# Patient Record
Sex: Female | Born: 1975
Health system: Southern US, Community
[De-identification: ages and names within clinical notes are randomized; demographics above are authoritative.]

---

## 2002-09-09 ENCOUNTER — Emergency Department (HOSPITAL_COMMUNITY): Admission: EM | Admit: 2002-09-09 | Discharge: 2002-09-09 | Payer: Self-pay

## 2002-09-11 ENCOUNTER — Encounter: Payer: Self-pay | Admitting: Family Medicine

## 2002-09-11 ENCOUNTER — Inpatient Hospital Stay (HOSPITAL_COMMUNITY): Admission: AD | Admit: 2002-09-11 | Discharge: 2002-09-11 | Payer: Self-pay | Admitting: Family Medicine

## 2010-05-17 ENCOUNTER — Emergency Department (HOSPITAL_COMMUNITY)
Admission: EM | Admit: 2010-05-17 | Discharge: 2010-05-17 | Payer: Self-pay | Source: Home / Self Care | Admitting: Emergency Medicine

## 2010-06-09 ENCOUNTER — Emergency Department (HOSPITAL_COMMUNITY)
Admission: EM | Admit: 2010-06-09 | Discharge: 2010-06-09 | Disposition: A | Discharge status: Home / Self Care | Payer: Self-pay | Attending: Emergency Medicine | Admitting: Emergency Medicine

## 2010-06-09 DIAGNOSIS — L02419 Cutaneous abscess of limb, unspecified: Secondary | ICD-10-CM | POA: Insufficient documentation

## 2010-06-09 DIAGNOSIS — M79609 Pain in unspecified limb: Secondary | ICD-10-CM | POA: Insufficient documentation

## 2011-08-16 ENCOUNTER — Emergency Department (HOSPITAL_COMMUNITY): Payer: Self-pay

## 2011-08-16 ENCOUNTER — Emergency Department (HOSPITAL_COMMUNITY)
Admission: EM | Admit: 2011-08-16 | Discharge: 2011-08-16 | Disposition: A | Discharge status: Home / Self Care | Payer: Self-pay | Attending: Emergency Medicine | Admitting: Emergency Medicine

## 2011-08-16 ENCOUNTER — Encounter (HOSPITAL_COMMUNITY): Payer: Self-pay | Admitting: Emergency Medicine

## 2011-08-16 DIAGNOSIS — IMO0001 Reserved for inherently not codable concepts without codable children: Secondary | ICD-10-CM | POA: Insufficient documentation

## 2011-08-16 DIAGNOSIS — R0602 Shortness of breath: Secondary | ICD-10-CM | POA: Insufficient documentation

## 2011-08-16 DIAGNOSIS — R5381 Other malaise: Secondary | ICD-10-CM | POA: Insufficient documentation

## 2011-08-16 DIAGNOSIS — R110 Nausea: Secondary | ICD-10-CM | POA: Insufficient documentation

## 2011-08-16 DIAGNOSIS — R05 Cough: Secondary | ICD-10-CM | POA: Insufficient documentation

## 2011-08-16 DIAGNOSIS — R7309 Other abnormal glucose: Secondary | ICD-10-CM | POA: Insufficient documentation

## 2011-08-16 DIAGNOSIS — R059 Cough, unspecified: Secondary | ICD-10-CM | POA: Insufficient documentation

## 2011-08-16 DIAGNOSIS — F17200 Nicotine dependence, unspecified, uncomplicated: Secondary | ICD-10-CM | POA: Insufficient documentation

## 2011-08-16 DIAGNOSIS — R739 Hyperglycemia, unspecified: Secondary | ICD-10-CM

## 2011-08-16 DIAGNOSIS — J111 Influenza due to unidentified influenza virus with other respiratory manifestations: Secondary | ICD-10-CM | POA: Insufficient documentation

## 2011-08-16 DIAGNOSIS — R509 Fever, unspecified: Secondary | ICD-10-CM | POA: Insufficient documentation

## 2011-08-16 LAB — PREGNANCY, URINE: Preg Test, Ur: NEGATIVE

## 2011-08-16 LAB — URINALYSIS, ROUTINE W REFLEX MICROSCOPIC
Glucose, UA: 500 mg/dL — AB
Leukocytes, UA: NEGATIVE
Nitrite: NEGATIVE
Protein, ur: 100 mg/dL — AB
Urobilinogen, UA: 0.2 mg/dL (ref 0.0–1.0)

## 2011-08-16 LAB — POCT I-STAT, CHEM 8
Chloride: 99 mEq/L (ref 96–112)
HCT: 46 % (ref 36.0–46.0)
Potassium: 3.8 mEq/L (ref 3.5–5.1)

## 2011-08-16 LAB — URINE MICROSCOPIC-ADD ON

## 2011-08-16 MED ORDER — ACETAMINOPHEN 325 MG PO TABS
ORAL_TABLET | ORAL | Status: AC
  Administered 2011-08-16: 650 mg
  Filled 2011-08-16: qty 2

## 2011-08-16 NOTE — ED Provider Notes (Signed)
History     CSN: [id number]  Arrival date & time 08/16/11  1419   First MD Initiated Contact with Patient 08/16/11 1553      Chief Complaint  Patient presents with  . Influenza    (Consider location/radiation/quality/duration/timing/severity/associated sxs/prior treatment) HPI Patient presents with complaints of generalized fatigue, myalgias, nausea without any vomiting. She's had a low-grade fever at home ranging from 100-101. She's had mild somewhat productive cough. She denies any difficulty breathing. She states she has been eating and drinking normally. She has no abdominal pain or chest pain denies sore throat. She has a daughter who has similar symptoms and has begun to improve. There no other associated systemic symptoms. She tried an over-the-counter cold medicine for her symptoms which did not provide very much relief. There no other alleviating or modifying factors. History reviewed. No pertinent past medical history.  History reviewed. No pertinent past surgical history.  History reviewed. No pertinent family history.  History  Substance Use Topics  . Smoking status: Current Everyday Smoker  . Smokeless tobacco: Not on file  . Alcohol Use: Yes     occasional    OB History    Grav Para Term Preterm Abortions TAB SAB Ect Mult Living                  Review of Systems ROS reviewed and all otherwise negative except for mentioned in HPI  Allergies  Sulfa antibiotics  Home Medications   Current Outpatient Rx  Name Route Sig Dispense Refill  . OVER THE COUNTER MEDICATION Oral Take 2 tablets by mouth every 6 (six) hours as needed. As needed for cold symptoms.    Marland Kitchen OVER THE COUNTER MEDICATION Oral Take 10 mLs by mouth once as needed. Night time cold medicine. As needed for fever and cold symptoms.      BP 118/79  Pulse 71  Temp(Src) 99.6 F (37.6 C) (Oral)  Resp 16  SpO2 96%  LMP 07/06/2011 Vitals reviewed Physical Exam Physical Examination: General  appearance - alert, well appearing, and in no distress Mental status - alert, oriented to person, place, and time Eyes - pupils equal and reactive, no conjunctival injection or scleral icterus Mouth - mucous membranes moist, pharynx normal without lesions Neck - supple, no significant adenopathy Chest - clear to auscultation, no wheezes, rales or rhonchi, symmetric air entry Heart - normal rate, regular rhythm, normal S1, S2, no murmurs, rubs, clicks or gallops Abdomen - soft, nontender, nondistended, no masses or organomegaly, nabs Extremities - peripheral pulses normal, no pedal edema, no clubbing or cyanosis Skin - normal coloration and turgor, no rashes, brisk cap refill  ED Course  Procedures (including critical care time)  Labs Reviewed  URINALYSIS, ROUTINE W REFLEX MICROSCOPIC - Abnormal; Notable for the following:    APPearance CLOUDY (*)    Specific Gravity, Urine 1.031 (*)    Glucose, UA 500 (*)    Protein, ur 100 (*)    All other components within normal limits  URINE MICROSCOPIC-ADD ON - Abnormal; Notable for the following:    Squamous Epithelial / LPF FEW (*)    Bacteria, UA FEW (*)    Casts GRANULAR CAST (*)    All other components within normal limits  POCT I-STAT, CHEM 8 - Abnormal; Notable for the following:    Glucose, Bld 185 (*)    Calcium, Ion 1.10 (*)    Hemoglobin 15.6 (*)    All other components within normal limits  PREGNANCY, URINE  Dg Chest 2 View  08/16/2011  *RADIOLOGY REPORT*  Clinical Data: Cough and shortness of breath.  Fever.  CHEST - 2 VIEW  Comparison: Chest x-ray and rib films 05/17/2010.  Findings: The heart size is normal.  The lungs are clear.  The visualized soft tissues and bony thorax are unremarkable.  IMPRESSION: Negative chest.  Original Report Authenticated By: Jamesetta Orleans. MATTERN, M.D.     1. Influenza-like illness   2. Hyperglycemia       MDM  Patient presents with symptoms consistent with an influenza-like illness. She  is overall nontoxic appearing. She was given ibuprofen for body aches. Her urinalysis showed protein and glucose. Therefore an i-STAT was checked. Chest x-ray reveals no pneumonia or other acute findings.  Glucose was elevated, patient was notified of this finding.  Discharged with strict return precautions.  Pt agreeable with plan.        Ethelda Chick, MD 08/16/11 [phone]

## 2011-08-16 NOTE — Discharge Instructions (Signed)
Return to the ED with any concerns including difficulty breathing, vomiting and not able to keep down liquids, fainting, chest or abdominal pain, or any other alarming symptoms.  He should take ibuprofen as needed for fever and body aches as well as increase your fluid intake.

## 2011-08-16 NOTE — ED Notes (Signed)
Pt c/o flu like sx x 4 days with HA, fever, body aches and nausea; URI sx also
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
## 2013-05-14 ENCOUNTER — Encounter: Payer: 59 | Attending: Family Medicine | Admitting: *Deleted

## 2013-05-14 ENCOUNTER — Encounter (INDEPENDENT_AMBULATORY_CARE_PROVIDER_SITE_OTHER): Payer: Self-pay

## 2013-05-14 ENCOUNTER — Encounter: Payer: Self-pay | Admitting: *Deleted

## 2013-05-14 VITALS — Ht 68.0 in | Wt 226.5 lb

## 2013-05-14 DIAGNOSIS — Z713 Dietary counseling and surveillance: Secondary | ICD-10-CM | POA: Insufficient documentation

## 2013-05-14 DIAGNOSIS — E119 Type 2 diabetes mellitus without complications: Secondary | ICD-10-CM | POA: Insufficient documentation

## 2013-05-14 NOTE — Progress Notes (Signed)
Appt start time: 0800 end time:  0930.  Assessment:  Patient was seen on  05/14/13 for individual diabetes education. New diagnosis of T2DM 04/29/2013 during routine physical exam. Lives with domestic partner, she and partner both do shopping and cooking. Her grandmother passed away from complications of I2ME. The grandmother and her aunt (alive) both on insulin. Aunt has been able to provide some direction in bahavior modification. Shakerra is a water drinker, coffee with splenda. Glucose testing before dinner (only) per Dr. Berdine Addison. Patient works at [employer] as a Geophysicist/field seismologist, working six days a week. As of February she will be decreasing to work 5 days a week.   Current HbA1c: 14.7%  Preferred Learning Style:   Auditory  Visual  Hands on  Learning Readiness:   Ready  MEDICATIONS: See List: Metformin 500 XR with dinner  Usual physical activity: Walking 30 minutes 3-4 times per week  Intervention:  Nutrition counseling provided.  Discussed diabetes disease process and treatment options.  Discussed physiology of diabetes and role of obesity on insulin resistance.  Encouraged moderate weight reduction to improve glucose levels.  Discussed role of medications and diet in glucose control  Provided education on macronutrients on glucose levels.  Provided education on carb counting, importance of regularly scheduled meals/snacks, and meal planning  Discussed effects of physical activity on glucose levels and long-term glucose control.  Recommended 150 minutes of physical activity/week.  Reviewed patient medications.  Discussed role of medication on blood glucose and possible side effects  Discussed blood glucose monitoring and interpretation.  Discussed recommended target ranges and individual ranges.    Described short-term complications: hyper- and hypo-glycemia.  Discussed causes,symptoms, and treatment options.  Discussed prevention, detection, and treatment of long-term  complications.  Discussed the role of prolonged elevated glucose levels on body systems.  Discussed role of stress on blood glucose levels and discussed strategies to manage psychosocial issues.  Discussed recommendations for long-term diabetes self-care.  Established checklist for medical, dental, and emotional self-care.  Plan:  Aim for 2-3 Carb Choices per meal (30-45 grams) +/- 1 either way  Aim for 0-1 Carbs per snack if hungry  Consider reading food labels for Total Carbohydrate and Fat Grams of foods Consider  increasing your activity level by walking for 30 minutes daily as tolerated Consider checking BG at alternate times per day to include first thing in the morning as directed by MD and LOG Continue taking medication  as directed by MD Always have protein and carbs together  Teaching Method Utilized:  Visual Auditory Hands on  Handouts given during visit include: Living Well with Diabetes Carb Counting and Food Label handouts Meal Plan Card Plate Method Snack sheet  Barriers to learning/adherence to lifestyle change: None  Diabetes self-care support plan:   [facility] support group  Friends and Family  Demonstrated degree of understanding via:  Teach Back   Monitoring/Evaluation:  Dietary intake, exercise, Test glucose, and body weight follow up prn.

## 2013-05-14 NOTE — Patient Instructions (Addendum)
Plan:  Aim for 2-3 Carb Choices per meal (30-45 grams) +/- 1 either way  Aim for 0-1 Carbs per snack if hungry  Consider reading food labels for Total Carbohydrate and Fat Grams of foods Consider  increasing your activity level by walking for 30 minutes daily as tolerated Consider checking BG at alternate times per day to include first thing in the morning directed by MD  Continue taking medication  as directed by MD Always have protein and carbs together

## 2014-02-04 ENCOUNTER — Other Ambulatory Visit: Payer: Self-pay | Admitting: Family Medicine

## 2014-02-04 DIAGNOSIS — E049 Nontoxic goiter, unspecified: Secondary | ICD-10-CM

## 2014-02-05 ENCOUNTER — Other Ambulatory Visit: Payer: 59

## 2014-03-05 ENCOUNTER — Ambulatory Visit
Admission: RE | Admit: 2014-03-05 | Discharge: 2014-03-05 | Disposition: A | Discharge status: Home / Self Care | Payer: 59 | Source: Ambulatory Visit | Attending: Family Medicine | Admitting: Family Medicine

## 2014-03-05 DIAGNOSIS — E049 Nontoxic goiter, unspecified: Secondary | ICD-10-CM

## 2014-04-15 IMAGING — CR DG CHEST 2V
2 series · 2 of 2 positions shown · non-contrast
Comparison: Chest x-ray and rib films 05/17/2010.

CLINICAL DATA: Cough and shortness of breath.  Fever.

CHEST - 2 VIEW

[w chest pa]
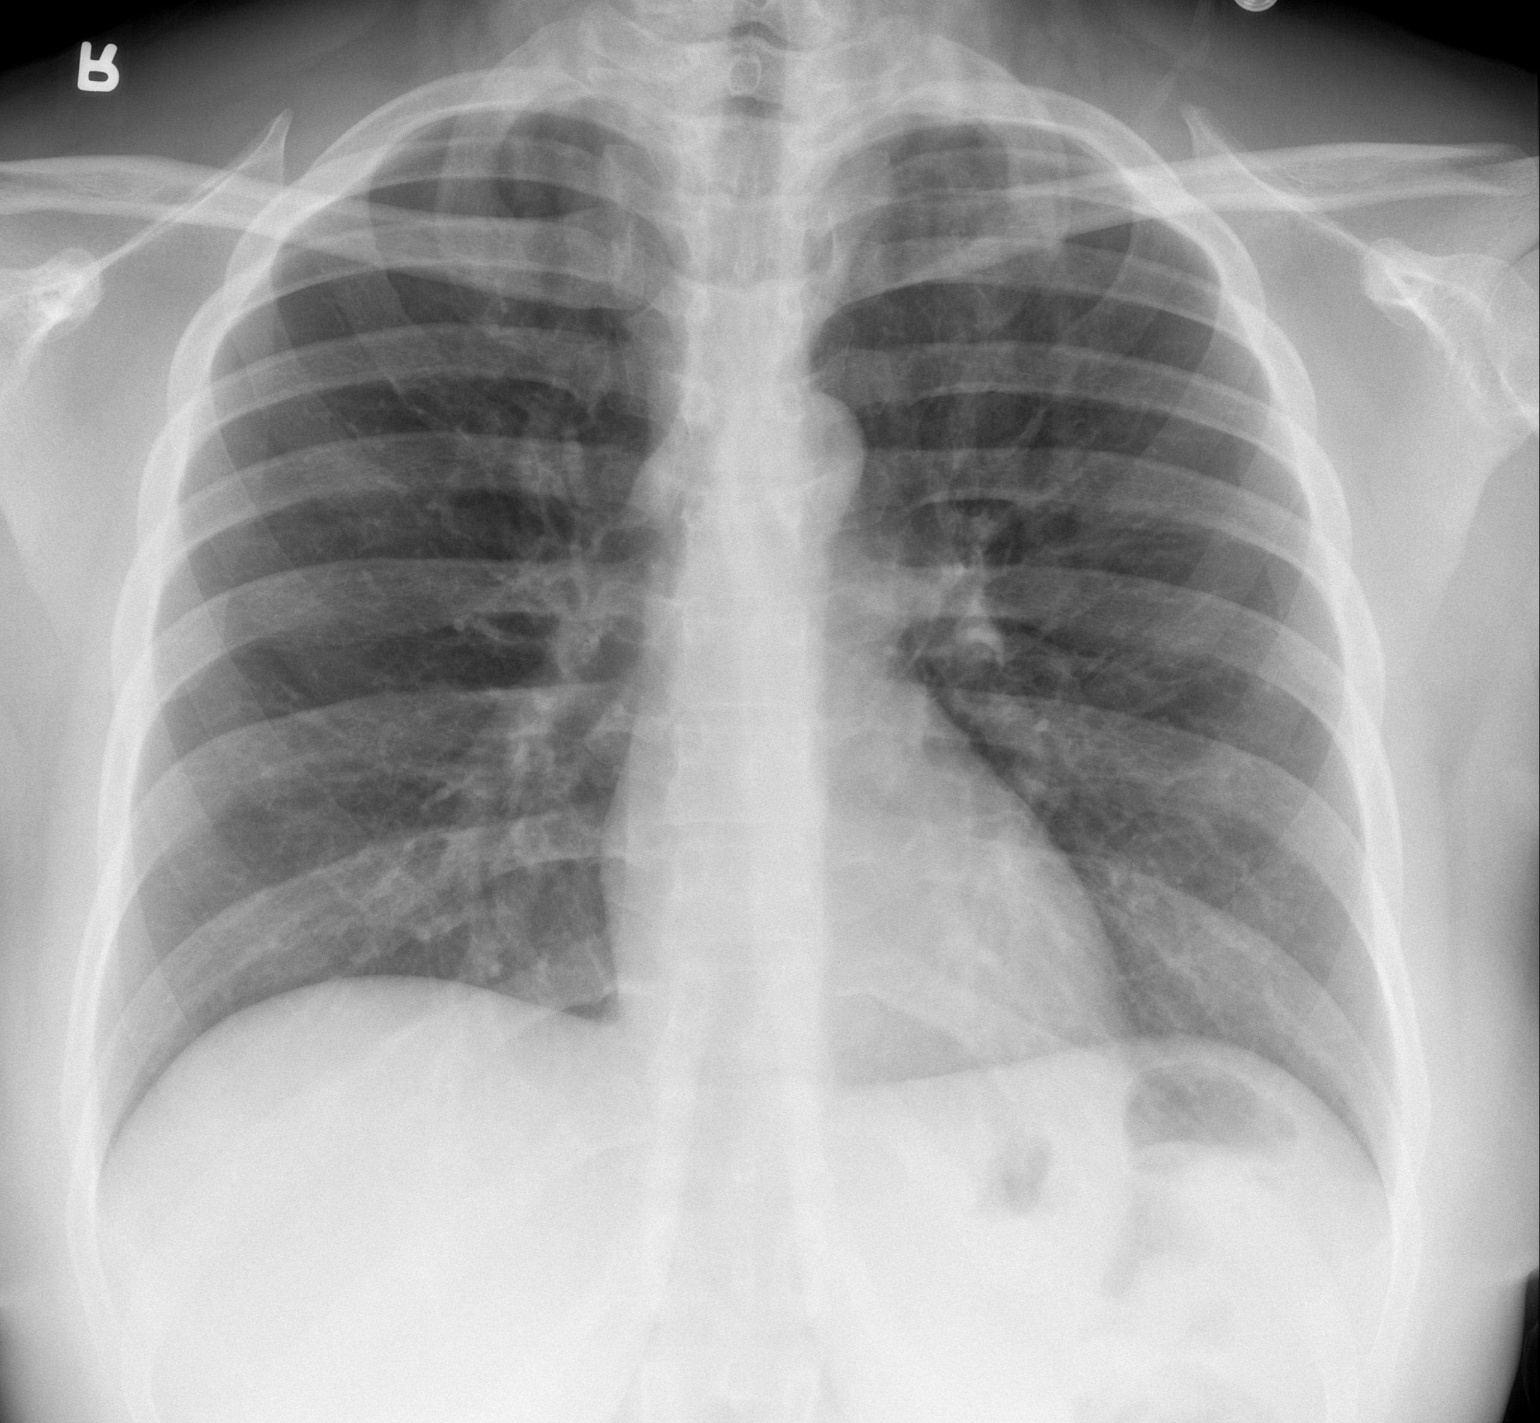

[w chest lat]
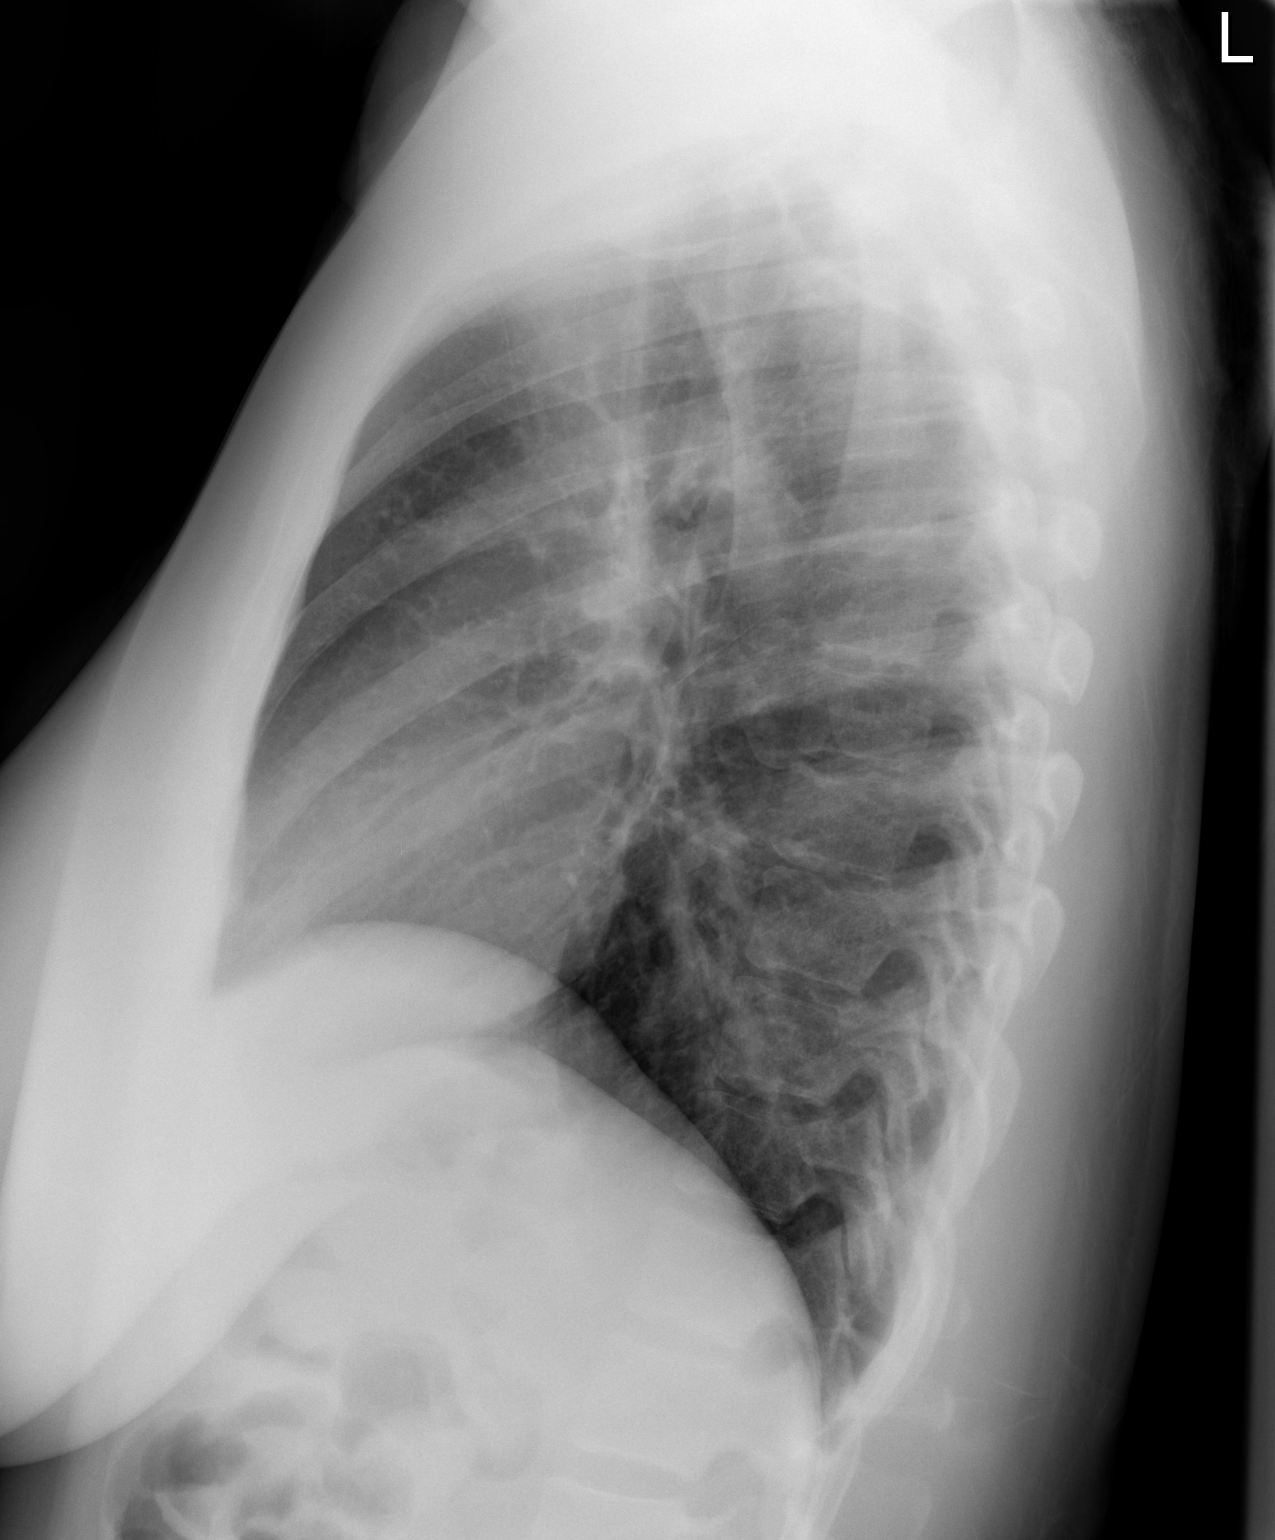

[2 of 2 positions shown; findings below may reference images not displayed]

FINDINGS: The heart size is normal.  The lungs are clear.  The
visualized soft tissues and bony thorax are unremarkable.
IMPRESSION: Negative chest.

## 2016-10-29 ENCOUNTER — Other Ambulatory Visit: Payer: Self-pay | Admitting: Family Medicine

## 2016-10-29 ENCOUNTER — Other Ambulatory Visit (HOSPITAL_COMMUNITY)
Admission: RE | Admit: 2016-10-29 | Discharge: 2016-10-29 | Disposition: A | Discharge status: Home / Self Care | Payer: BLUE CROSS/BLUE SHIELD | Source: Ambulatory Visit | Attending: Family Medicine | Admitting: Family Medicine

## 2016-10-29 DIAGNOSIS — Z113 Encounter for screening for infections with a predominantly sexual mode of transmission: Secondary | ICD-10-CM | POA: Insufficient documentation

## 2016-10-29 DIAGNOSIS — I10 Essential (primary) hypertension: Secondary | ICD-10-CM | POA: Diagnosis not present

## 2016-10-29 DIAGNOSIS — E118 Type 2 diabetes mellitus with unspecified complications: Secondary | ICD-10-CM | POA: Diagnosis not present

## 2016-11-02 LAB — URINE CYTOLOGY ANCILLARY ONLY
CANDIDA VAGINITIS: NEGATIVE
Chlamydia: NEGATIVE
Neisseria Gonorrhea: NEGATIVE
TRICH (WINDOWPATH): NEGATIVE

## 2016-11-02 IMAGING — US US SOFT TISSUE HEAD/NECK
1 series · 13 of 25 positions shown · non-contrast
Comparison: None.

CLINICAL DATA: 38-year-old female with a history of goiter

EXAM:
THYROID ULTRASOUND
TECHNIQUE: Ultrasound examination of the thyroid gland and adjacent soft
tissues was performed.

[Series 1: us soft tissue head/neck · 0.08mm/px · 13 of 53 slices shown]
[im 1/53]
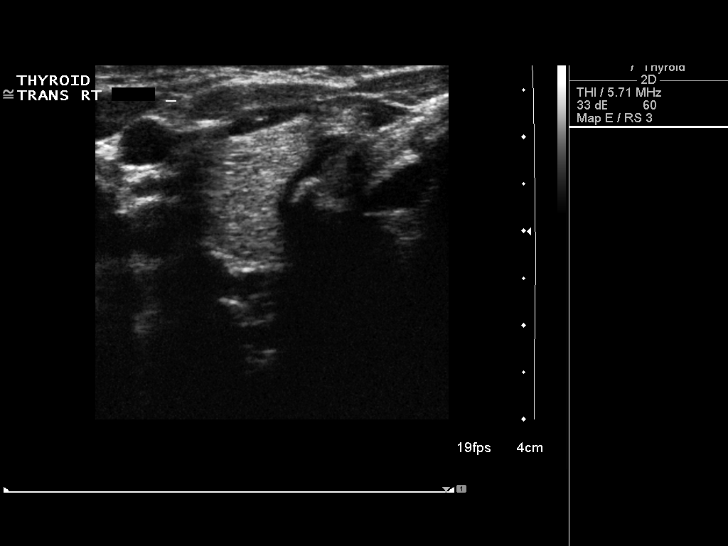
[im 5/53]
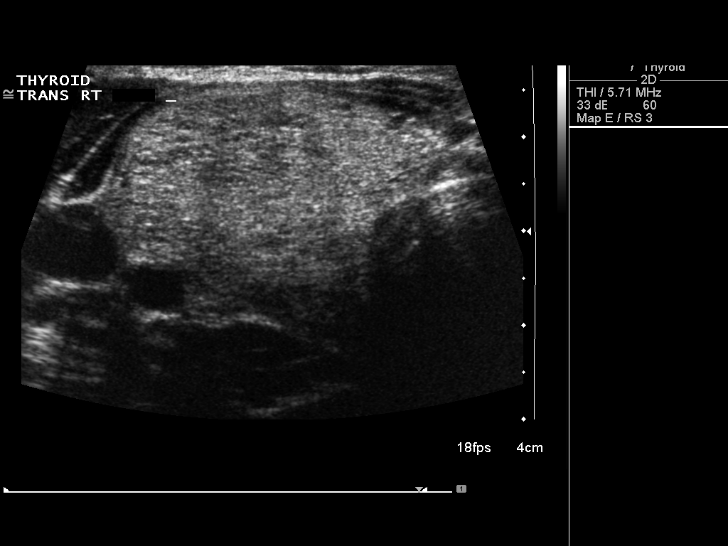
[im 9/53]
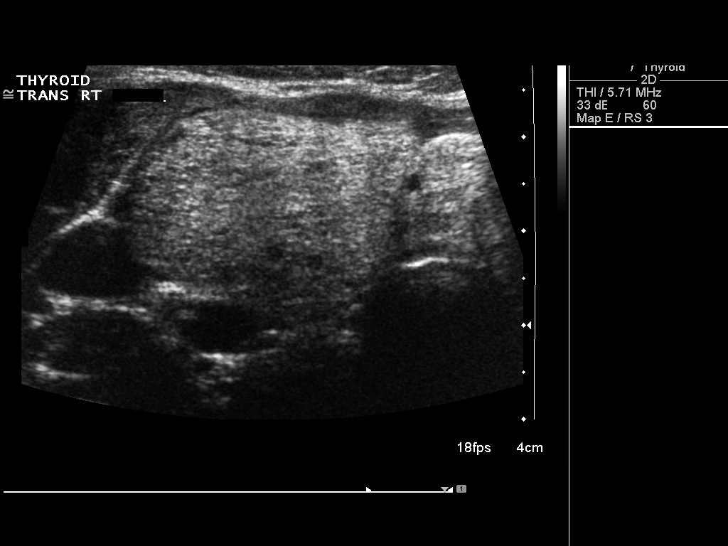
[im 14/53]
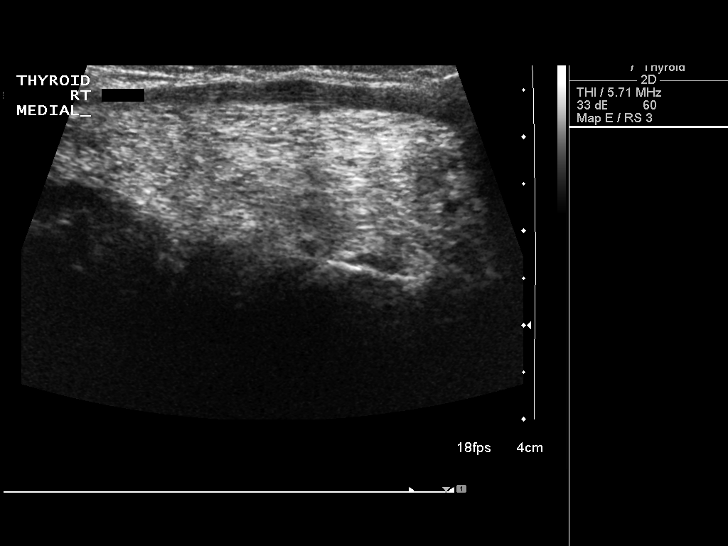
[im 18/53]
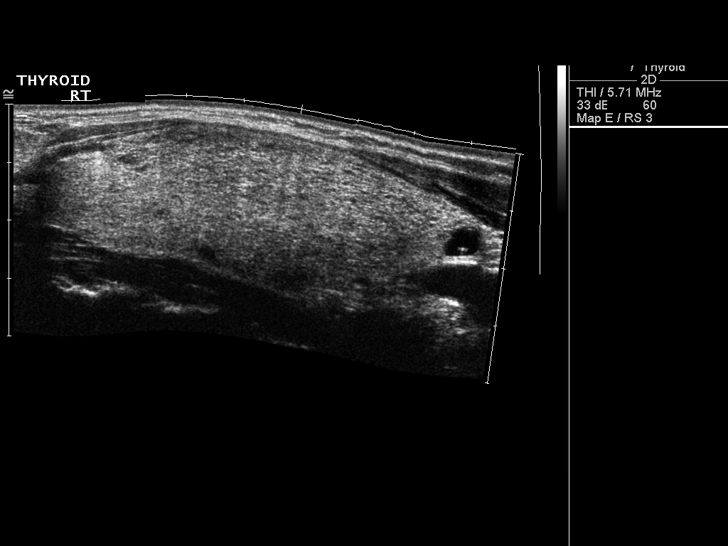
[im 22/53]
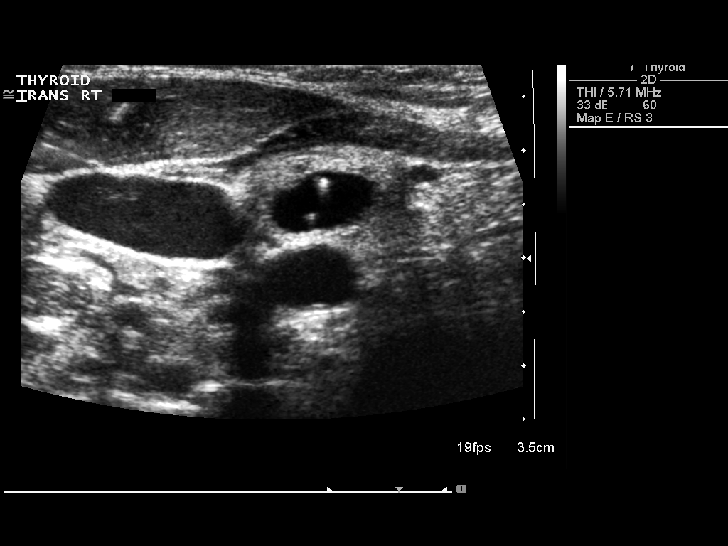
[im 27/53]
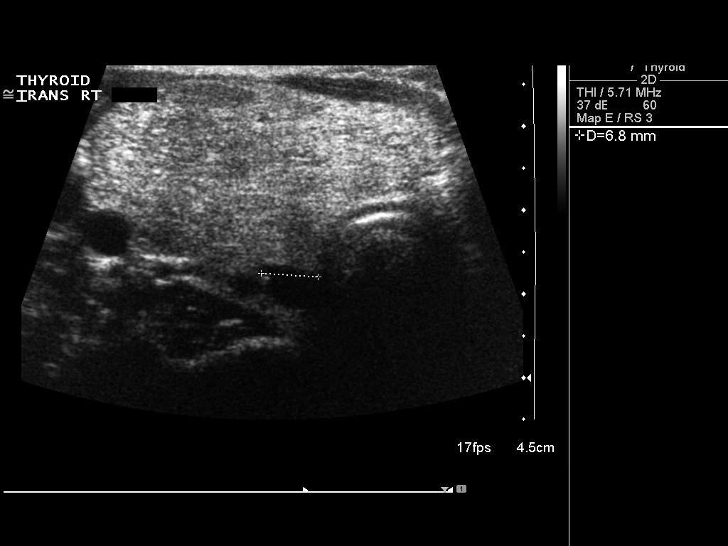
[im 31/53]
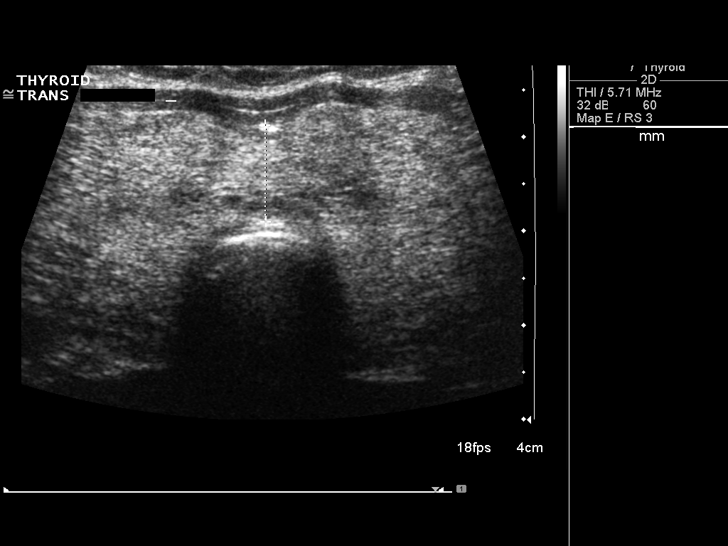
[im 35/53]
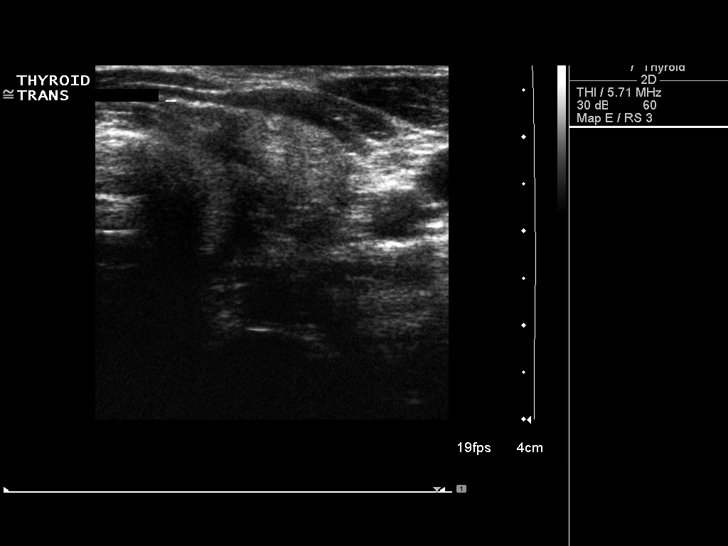
[im 40/53]
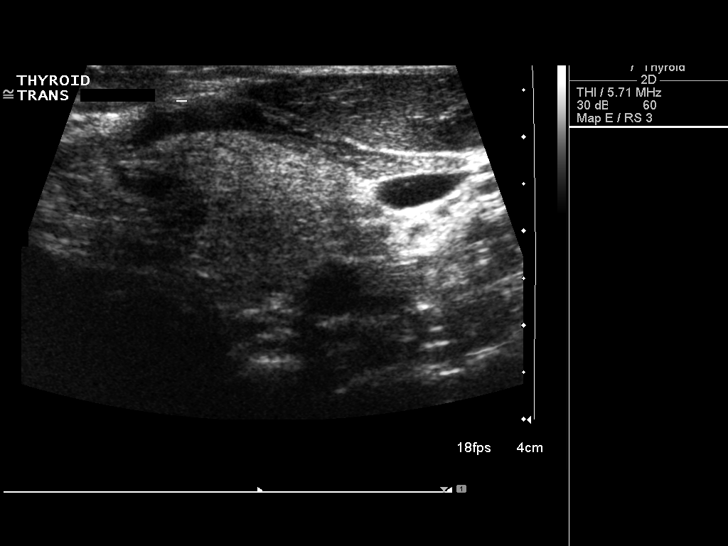
[im 44/53]
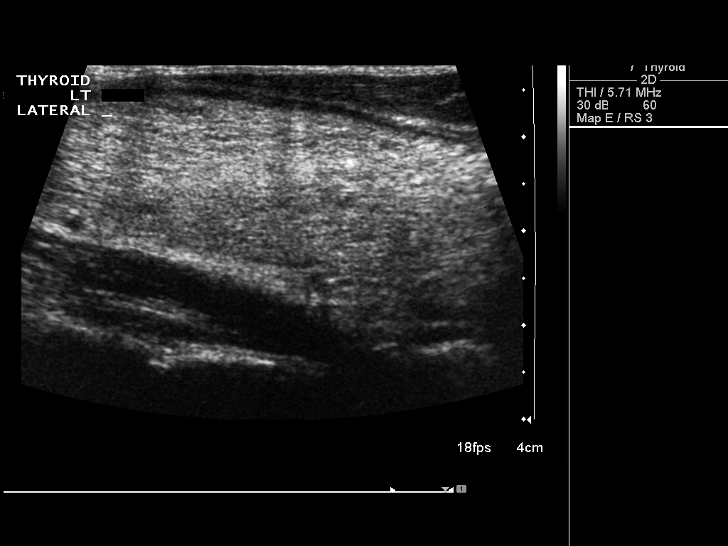
[im 48/53]
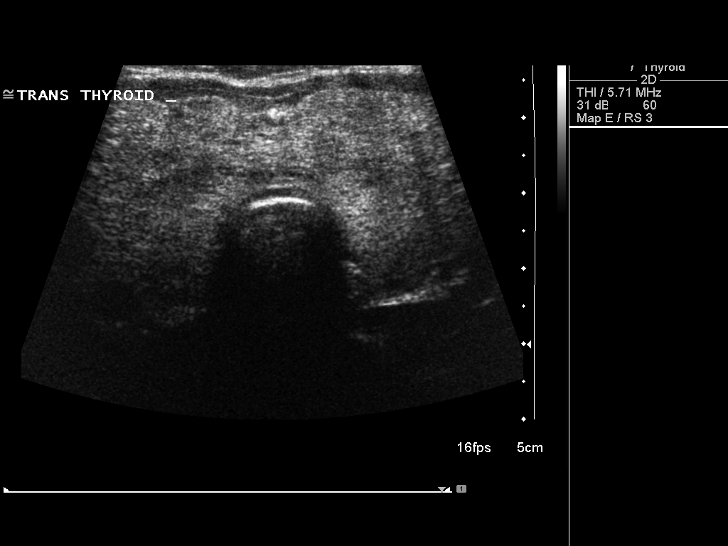
[im 53/53]
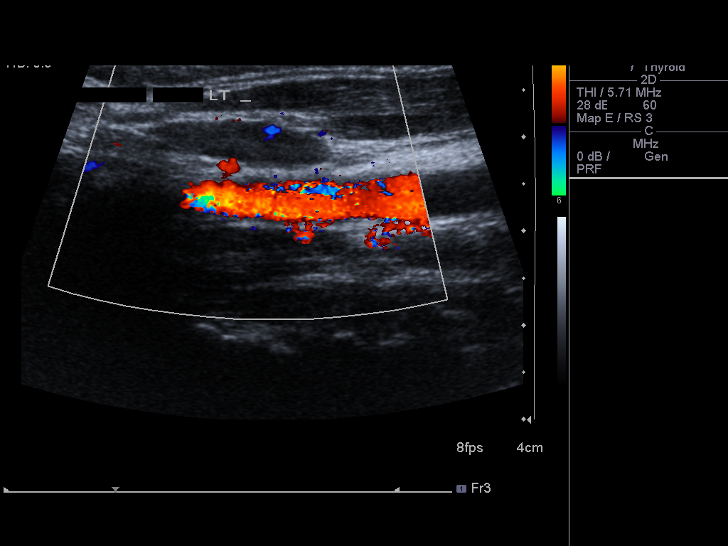

[13 of 25 positions shown; findings below may reference images not displayed]

FINDINGS: Right thyroid lobe

Measurements: 8.2 cm x 2.6 cm x 3.4 cm. Hypoechoic nodule at the
inferior right thyroid measuring 8 mm x 5 mm x 10 mm with cystic
features and slight complexity of the wall.

Smaller more inferior hypo echoic cyst/nodule measures 8 mm x 5 mm x
7 mm.

No significant flow on duplex.

Left thyroid lobe

Measurements: 6.3 cm x 2.6 cm x 2.8 cm. No nodules visualized. No
significant increased flow on duplex.

Isthmus

Thickness: 1 cm.  No nodules visualized.

Lymphadenopathy

None visualized.
IMPRESSION: Enlarged thyroid with right sided nodules below the threshold for
biopsy.

Findings do not meet current SRU consensus criteria for biopsy.
Follow-up by clinical exam is recommended. If patient has known risk
factors for thyroid carcinoma, consider follow-up ultrasound in 12
months. If patient is clinically hyperthyroid, consider nuclear
medicine thyroid uptake and scan.Reference: Management of Thyroid
Nodules Detected at US: Society of Radiologists in Ultrasound

## 2017-01-28 ENCOUNTER — Other Ambulatory Visit: Payer: Self-pay | Admitting: Family Medicine

## 2017-01-28 ENCOUNTER — Other Ambulatory Visit (HOSPITAL_COMMUNITY)
Admission: RE | Admit: 2017-01-28 | Discharge: 2017-01-28 | Disposition: A | Discharge status: Home / Self Care | Payer: BLUE CROSS/BLUE SHIELD | Source: Ambulatory Visit | Attending: Family Medicine | Admitting: Family Medicine

## 2017-01-28 DIAGNOSIS — Z124 Encounter for screening for malignant neoplasm of cervix: Secondary | ICD-10-CM | POA: Diagnosis not present

## 2017-01-28 DIAGNOSIS — Z01411 Encounter for gynecological examination (general) (routine) with abnormal findings: Secondary | ICD-10-CM | POA: Diagnosis not present

## 2017-01-28 DIAGNOSIS — N760 Acute vaginitis: Secondary | ICD-10-CM | POA: Diagnosis not present

## 2017-01-28 DIAGNOSIS — I10 Essential (primary) hypertension: Secondary | ICD-10-CM | POA: Diagnosis not present

## 2017-01-28 DIAGNOSIS — Z1231 Encounter for screening mammogram for malignant neoplasm of breast: Secondary | ICD-10-CM | POA: Diagnosis not present

## 2017-01-28 DIAGNOSIS — E118 Type 2 diabetes mellitus with unspecified complications: Secondary | ICD-10-CM | POA: Diagnosis not present

## 2017-02-01 DIAGNOSIS — R922 Inconclusive mammogram: Secondary | ICD-10-CM | POA: Diagnosis not present

## 2017-02-01 LAB — CYTOLOGY - PAP
Bacterial vaginitis: NEGATIVE
CANDIDA VAGINITIS: NEGATIVE
CHLAMYDIA, DNA PROBE: NEGATIVE
Diagnosis: NEGATIVE
HERPES (WINDOWPATH): NEGATIVE
HPV: NOT DETECTED
NEISSERIA GONORRHEA: NEGATIVE
Trichomonas: NEGATIVE

## 2017-07-30 DIAGNOSIS — N946 Dysmenorrhea, unspecified: Secondary | ICD-10-CM | POA: Diagnosis not present

## 2017-07-30 DIAGNOSIS — I10 Essential (primary) hypertension: Secondary | ICD-10-CM | POA: Diagnosis not present

## 2017-07-30 DIAGNOSIS — E119 Type 2 diabetes mellitus without complications: Secondary | ICD-10-CM | POA: Diagnosis not present

## 2017-10-29 DIAGNOSIS — E785 Hyperlipidemia, unspecified: Secondary | ICD-10-CM | POA: Diagnosis not present

## 2017-10-29 DIAGNOSIS — E118 Type 2 diabetes mellitus with unspecified complications: Secondary | ICD-10-CM | POA: Diagnosis not present

## 2017-10-29 DIAGNOSIS — E119 Type 2 diabetes mellitus without complications: Secondary | ICD-10-CM | POA: Diagnosis not present

## 2017-10-29 DIAGNOSIS — I10 Essential (primary) hypertension: Secondary | ICD-10-CM | POA: Diagnosis not present

## 2019-12-08 ENCOUNTER — Other Ambulatory Visit (HOSPITAL_COMMUNITY)
Admission: RE | Admit: 2019-12-08 | Discharge: 2019-12-08 | Disposition: A | Discharge status: Home / Self Care | Payer: Self-pay | Source: Ambulatory Visit | Attending: Family Medicine | Admitting: Family Medicine

## 2019-12-08 ENCOUNTER — Other Ambulatory Visit: Payer: Self-pay | Admitting: Family Medicine

## 2019-12-08 DIAGNOSIS — N898 Other specified noninflammatory disorders of vagina: Secondary | ICD-10-CM | POA: Insufficient documentation

## 2019-12-10 LAB — MOLECULAR ANCILLARY ONLY
Bacterial Vaginitis (gardnerella): NEGATIVE
Candida Glabrata: NEGATIVE
Candida Vaginitis: NEGATIVE
Chlamydia: NEGATIVE
Comment: NEGATIVE
Comment: NEGATIVE
Comment: NEGATIVE
Comment: NEGATIVE
Comment: NEGATIVE
Comment: NORMAL
Neisseria Gonorrhea: NEGATIVE
Trichomonas: NEGATIVE

## 2020-02-20 ENCOUNTER — Ambulatory Visit: Payer: Self-pay | Attending: Internal Medicine

## 2020-02-20 DIAGNOSIS — Z23 Encounter for immunization: Secondary | ICD-10-CM

## 2020-02-20 NOTE — Progress Notes (Signed)
   Covid-19 Vaccination [facility]  Name:  Kathleen Freeman    MRN: [id number] DOB: 1975/06/27  02/20/2020  [name] was observed post Covid-19 immunization for 15 minutes without incident. She was provided with Vaccine Information Sheet and instruction to access the V-Safe system.   [name] was instructed to call 911 with any severe reactions post vaccine: Marland Kitchen Difficulty breathing  . Swelling of face and throat  . A fast heartbeat  . A bad rash all over body  . Dizziness and weakness
